# Patient Record
Sex: Female | Born: 1985 | Race: White | Hispanic: No | Marital: Single | Smoking: Current every day smoker
Health system: Southern US, Community
[De-identification: ages and names within clinical notes are randomized; demographics above are authoritative.]

## PROBLEM LIST (undated history)

## (undated) HISTORY — PX: APPENDECTOMY: SHX54

---

## 2012-07-11 ENCOUNTER — Emergency Department (HOSPITAL_COMMUNITY)
Admission: EM | Admit: 2012-07-11 | Discharge: 2012-07-11 | Disposition: A | Discharge status: Home / Self Care | Payer: Managed Care, Other (non HMO) | Attending: Emergency Medicine | Admitting: Emergency Medicine

## 2012-07-11 ENCOUNTER — Encounter (HOSPITAL_COMMUNITY): Payer: Self-pay | Admitting: Emergency Medicine

## 2012-07-11 DIAGNOSIS — N390 Urinary tract infection, site not specified: Secondary | ICD-10-CM | POA: Insufficient documentation

## 2012-07-11 DIAGNOSIS — Z792 Long term (current) use of antibiotics: Secondary | ICD-10-CM | POA: Insufficient documentation

## 2012-07-11 DIAGNOSIS — N9489 Other specified conditions associated with female genital organs and menstrual cycle: Secondary | ICD-10-CM | POA: Insufficient documentation

## 2012-07-11 DIAGNOSIS — T367X5A Adverse effect of antifungal antibiotics, systemically used, initial encounter: Secondary | ICD-10-CM | POA: Insufficient documentation

## 2012-07-11 DIAGNOSIS — T7840XA Allergy, unspecified, initial encounter: Secondary | ICD-10-CM

## 2012-07-11 DIAGNOSIS — Z888 Allergy status to other drugs, medicaments and biological substances status: Secondary | ICD-10-CM | POA: Insufficient documentation

## 2012-07-11 DIAGNOSIS — Z79899 Other long term (current) drug therapy: Secondary | ICD-10-CM | POA: Insufficient documentation

## 2012-07-11 DIAGNOSIS — N760 Acute vaginitis: Secondary | ICD-10-CM | POA: Insufficient documentation

## 2012-07-11 DIAGNOSIS — N898 Other specified noninflammatory disorders of vagina: Secondary | ICD-10-CM | POA: Insufficient documentation

## 2012-07-11 DIAGNOSIS — IMO0002 Reserved for concepts with insufficient information to code with codable children: Secondary | ICD-10-CM | POA: Insufficient documentation

## 2012-07-11 DIAGNOSIS — F17200 Nicotine dependence, unspecified, uncomplicated: Secondary | ICD-10-CM | POA: Insufficient documentation

## 2012-07-11 LAB — WET PREP, GENITAL: Yeast Wet Prep HPF POC: NONE SEEN

## 2012-07-11 MED ORDER — CLINDAMYCIN HCL 150 MG PO CAPS
300.0000 mg | ORAL_CAPSULE | Freq: Once | ORAL | Status: AC
  Administered 2012-07-11: 300 mg via ORAL
  Filled 2012-07-11: qty 2

## 2012-07-11 MED ORDER — CLINDAMYCIN HCL 300 MG PO CAPS: 300.0000 mg | ORAL_CAPSULE | Freq: Two times a day (BID) | ORAL | Status: AC

## 2012-07-11 MED ORDER — ETONOGESTREL-ETHINYL ESTRADIOL 0.12-0.015 MG/24HR VA RING: VAGINAL_RING | VAGINAL | Status: DC

## 2012-07-11 MED ORDER — LIDOCAINE HCL 2 % EX GEL
Freq: Once | CUTANEOUS | Status: AC
  Administered 2012-07-11: 20 via URETHRAL
  Filled 2012-07-11: qty 20

## 2012-07-11 MED ORDER — HYDROCORTISONE 1 % EX OINT
TOPICAL_OINTMENT | Freq: Once | CUTANEOUS | Status: AC
  Administered 2012-07-11: 03:00:00 via TOPICAL
  Filled 2012-07-11: qty 28.35

## 2012-07-11 MED ORDER — PREDNISONE 20 MG PO TABS
60.0000 mg | ORAL_TABLET | Freq: Once | ORAL | Status: AC
  Administered 2012-07-11: 60 mg via ORAL
  Filled 2012-07-11: qty 3

## 2012-07-11 MED ORDER — DIPHENHYDRAMINE HCL 25 MG PO CAPS
25.0000 mg | ORAL_CAPSULE | Freq: Once | ORAL | Status: AC
  Administered 2012-07-11: 25 mg via ORAL
  Filled 2012-07-11: qty 1

## 2012-07-11 MED ORDER — DESONIDE 0.05 % EX OINT
TOPICAL_OINTMENT | Freq: Once | CUTANEOUS | Status: DC
  Filled 2012-07-11: qty 15

## 2012-07-11 MED ORDER — DESONIDE 0.05 % EX LOTN: TOPICAL_LOTION | Freq: Two times a day (BID) | CUTANEOUS | Status: DC

## 2012-07-11 MED ORDER — PREDNISONE 10 MG PO TABS: 20.0000 mg | ORAL_TABLET | Freq: Every day | ORAL | Status: DC

## 2012-07-11 NOTE — ED Notes (Signed)
Medication requested from pharmacy.

## 2012-07-11 NOTE — ED Notes (Signed)
PT. DIAGNOSED WITH UTI AND PRESCRIBED WITH MACROBID AT AN URGENT CARE LAST Monday , DEVELOPED YEAST INFECTION PRESCRIBED WITH VAGINAL CREAM AT SCHOOL - DEVELOPED IRRITATION /PAINFUL RASH FROM CREAM , PRESCRIBED WITH DIFLUCAN TABS WITH NO RELIEF.

## 2012-07-11 NOTE — ED Provider Notes (Signed)
Medical screening examination/treatment/procedure(s) were performed by non-physician practitioner and as supervising physician I was immediately available for consultation/collaboration.    Brandt Loosen, MD 07/11/12 [phone]

## 2012-07-11 NOTE — ED Provider Notes (Signed)
History     CSN: [id number]  Arrival date & time 07/11/12  0008   First MD Initiated Contact with Patient 07/11/12 0057      Chief Complaint  Patient presents with  . Rash    (Consider location/radiation/quality/duration/timing/severity/associated sxs/prior treatment) HPI Comments: PT. DIAGNOSED WITH UTI AND PRESCRIBED WITH MACROBID AT AN URGENT CARE LAST Monday , DEVELOPED YEAST INFECTION PRESCRIBED WITH VAGINAL CREAM AT SCHOOL - DEVELOPED IRRITATION /PAINFUL RASH FROM CREAM , PRESCRIBED WITH DIFLUCAN TABS WITH NO RELIEF.   Has been unable to bath properly due to pain Painful to touch  Boyfriend at bedside denies any penile discharge genital sores  Tested for STD 2 days ago at school clinic--negative   Patient is a 26 y.o. female presenting with rash. The history is provided by the patient.  Rash  This is a new problem. The current episode started 2 days ago. The problem has been gradually worsening. There has been no fever. The rash is present on the groin. The pain is at a severity of 9/10. The pain is moderate. The pain has been constant since onset. Associated symptoms include blisters and pain. She has tried nothing for the symptoms. The treatment provided no relief. Risk factors include new medications.    History reviewed. No pertinent past medical history.  Past Surgical History  Procedure Date  . Appendectomy     No family history on file.  History  Substance Use Topics  . Smoking status: Current Every Day Smoker  . Smokeless tobacco: Not on file  . Alcohol Use: Yes    OB History    Grav Para Term Preterm Abortions TAB SAB Ect Mult Living                  Review of Systems  Constitutional: Negative for fever and chills.  Genitourinary: Positive for dysuria, vaginal discharge and genital sores.  Skin: Positive for rash. Negative for wound.  Neurological: Negative for dizziness and weakness.    Allergies  Terconazole  Home Medications   Current  Outpatient Rx  Name Route Sig Dispense Refill  . FLUCONAZOLE 150 MG PO TABS Oral Take 150 mg by mouth once.    Marland Kitchen NITROFURANTOIN MONOHYD MACRO 100 MG PO CAPS Oral Take 100 mg by mouth 2 (two) times daily.    . TERCONAZOLE 0.4 % VA CREA Vaginal Place 1 applicator vaginally at bedtime.    Marland Kitchen CLINDAMYCIN HCL 300 MG PO CAPS Oral Take 1 capsule (300 mg total) by mouth 2 (two) times daily. 13 capsule 0  . DESONIDE 0.05 % EX LOTN Topical Apply topically 2 (two) times daily. 59 mL 0  . ETONOGESTREL-ETHINYL ESTRADIOL 0.12-0.015 MG/24HR VA RING  Insert vaginally and leave in place for 3 consecutive weeks, then remove for 1 week. 1 each 12  . PREDNISONE 10 MG PO TABS Oral Take 2 tablets (20 mg total) by mouth daily. 10 tablet 0    BP 115/71  Pulse 104  Temp 98.1 F (36.7 C) (Oral)  Resp 18  SpO2 100%  LMP 06/16/2012  Physical Exam  Constitutional: She appears well-developed and well-nourished.  HENT:  Head: Normocephalic.  Eyes: Pupils are equal, round, and reactive to light.  Neck: Normal range of motion.  Cardiovascular: Normal rate.   Pulmonary/Chest: Effort normal.  Abdominal: Soft. She exhibits no distension.  Genitourinary: There is rash, tenderness and lesion on the right labia. There is rash, tenderness and lesion on the left labia. Vaginal discharge found.  Copious thick yellow discharge with small open areas along edges of labia not consist ant with Herpes like lesions  Patient removed her Nuvaring thinking this may be the cause of her discharge  Musculoskeletal: Normal range of motion.  Lymphadenopathy:       Right: Inguinal adenopathy present.       Left: Inguinal adenopathy present.  Neurological: She is alert.  Skin: Skin is warm.    ED Course  Procedures (including critical care time)  Labs Reviewed  WET PREP, GENITAL - Abnormal; Notable for the following:    Clue Cells Wet Prep HPF POC MODERATE (*)     WBC, Wet Prep HPF POC MANY (*)     All other components  within normal limits   No results found.   1. Allergic reaction caused by a drug   2. Bacterial vaginitis       MDM          Arman Filter, NP 07/11/12 [phone]

## 2012-07-11 NOTE — ED Notes (Signed)
Call x 1. No answer.

## 2013-11-14 ENCOUNTER — Emergency Department (HOSPITAL_COMMUNITY)
Admission: EM | Admit: 2013-11-14 | Discharge: 2013-11-14 | Disposition: A | Discharge status: Home / Self Care | Payer: PRIVATE HEALTH INSURANCE | Attending: Emergency Medicine | Admitting: Emergency Medicine

## 2013-11-14 ENCOUNTER — Emergency Department (HOSPITAL_COMMUNITY): Payer: PRIVATE HEALTH INSURANCE

## 2013-11-14 ENCOUNTER — Encounter (HOSPITAL_COMMUNITY): Payer: Self-pay | Admitting: Emergency Medicine

## 2013-11-14 DIAGNOSIS — S52509A Unspecified fracture of the lower end of unspecified radius, initial encounter for closed fracture: Secondary | ICD-10-CM

## 2013-11-14 DIAGNOSIS — Y9301 Activity, walking, marching and hiking: Secondary | ICD-10-CM | POA: Insufficient documentation

## 2013-11-14 DIAGNOSIS — W010XXA Fall on same level from slipping, tripping and stumbling without subsequent striking against object, initial encounter: Secondary | ICD-10-CM | POA: Insufficient documentation

## 2013-11-14 DIAGNOSIS — S52599A Other fractures of lower end of unspecified radius, initial encounter for closed fracture: Secondary | ICD-10-CM | POA: Insufficient documentation

## 2013-11-14 DIAGNOSIS — Y929 Unspecified place or not applicable: Secondary | ICD-10-CM | POA: Insufficient documentation

## 2013-11-14 DIAGNOSIS — Z79899 Other long term (current) drug therapy: Secondary | ICD-10-CM | POA: Insufficient documentation

## 2013-11-14 DIAGNOSIS — F17200 Nicotine dependence, unspecified, uncomplicated: Secondary | ICD-10-CM | POA: Insufficient documentation

## 2013-11-14 MED ORDER — HYDROCODONE-ACETAMINOPHEN 5-325 MG PO TABS
1.0000 | ORAL_TABLET | Freq: Once | ORAL | Status: AC
  Administered 2013-11-14: 1 via ORAL
  Filled 2013-11-14: qty 1

## 2013-11-14 MED ORDER — HYDROCODONE-ACETAMINOPHEN 5-325 MG PO TABS: 1.0000 | ORAL_TABLET | ORAL | Status: AC | PRN

## 2013-11-14 NOTE — ED Notes (Signed)
She is awake, alert and oriented and in no distress.  She is made aware we are notifying ortho. Tech.

## 2013-11-14 NOTE — ED Provider Notes (Signed)
CSN: [id number]     Arrival date & time 11/14/13  1191 History   None    Chief Complaint  Patient presents with  . Fall     (Consider location/radiation/quality/duration/timing/severity/associated sxs/prior Treatment) HPI Comments: Pt states that she was drinking and slipped on the ice a couple of hours ago. No loc with fall. Pt c/o swelling and pain to the left wrist  Patient is a 28 y.o. female presenting with fall. The history is provided by the patient. No language interpreter was used.  Fall This is a new problem. The current episode started today. The problem occurs constantly. The problem has been unchanged. Pertinent negatives include no numbness. The symptoms are aggravated by twisting. She has tried nothing for the symptoms.    History reviewed. No pertinent past medical history. Past Surgical History  Procedure Laterality Date  . Appendectomy     History reviewed. No pertinent family history. History  Substance Use Topics  . Smoking status: Current Every Day Smoker  . Smokeless tobacco: Not on file  . Alcohol Use: Yes   OB History   Grav Para Term Preterm Abortions TAB SAB Ect Mult Living                 Review of Systems  Constitutional: Negative.   Respiratory: Negative.   Cardiovascular: Negative.   Neurological: Negative for numbness.      Allergies  Terconazole  Home Medications   Current Outpatient Rx  Name  Route  Sig  Dispense  Refill  . amphetamine-dextroamphetamine (ADDERALL) 20 MG tablet   Oral   Take 20 mg by mouth 2 (two) times daily.         Marland Kitchen ibuprofen (ADVIL,MOTRIN) 200 MG tablet   Oral   Take 400 mg by mouth every 6 (six) hours as needed.         Marland Kitchen PARAGARD INTRAUTERINE COPPER IU   Intrauterine   1 Device by Intrauterine route continuous.          BP 119/73  Pulse 102  Temp(Src) 98.3 F (36.8 C) (Oral)  Resp 14  SpO2 99%  LMP 11/05/2013 Physical Exam  Nursing note and vitals reviewed. Constitutional: She is  oriented to person, place, and time. She appears well-developed and well-nourished.  Cardiovascular: Normal rate and regular rhythm.   Pulmonary/Chest: Effort normal and breath sounds normal.  Musculoskeletal:  Swelling not the the medial aspect of the left wrist. Pulses intact. Pt unable to supinate.pt has full rom on fingers  Neurological: She is alert and oriented to person, place, and time. Coordination normal.  Skin: Skin is warm and dry.  Psychiatric: She has a normal mood and affect.    ED Course  Procedures (including critical care time) Labs Review Labs Reviewed - No data to display Imaging Review Dg Wrist Complete Left  11/14/2013   CLINICAL DATA:  Status post fall, with pain, swelling and bruising at the base of the left first metacarpal.  EXAM: LEFT WRIST - COMPLETE 3+ VIEW  COMPARISON:  None.  FINDINGS: There is a minimally displaced fracture involving the ulnar aspect of the distal radial metaphysis. No significant step-off is seen at the joint space. The ulnar styloid remains intact.  The carpal rows appear grossly intact, and demonstrate normal alignment. Visualized joint spaces are otherwise preserved. No significant soft tissue abnormalities are characterized on radiograph.  IMPRESSION: Minimally displaced fracture involving the ulnar aspect of the distal radial metaphysis.   Electronically Signed   By: Leotis Shames  Chang M.D.   On: 11/14/2013 06:58   Dg Hand Complete Left  11/14/2013   CLINICAL DATA:  Status post fall; pain, swelling and bruising at the base of the left first metacarpal.  EXAM: LEFT HAND - COMPLETE 3+ VIEW  COMPARISON:  None.  FINDINGS: There is a minimally displaced fracture involving the ulnar aspect of the distal radial metaphysis, better characterized on concurrent wrist radiographs. The base of the first metacarpal is grossly unremarkable in appearance. The joint spaces are preserved; the soft tissues are unremarkable in appearance. The carpal rows are intact,  and demonstrate normal alignment.  IMPRESSION: Minimally displaced fracture involving the ulnar aspect of the distal radial metaphysis, better characterized on concurrent wrist radiographs.   Electronically Signed   By: Roanna RaiderJeffery  Chang M.D.   On: 11/14/2013 06:57     EKG Interpretation None      MDM   Final diagnoses:  Distal radius fracture    Pt neurovascularly intact. Pt splinted given vicodin and follow up with dr. Alyson Reedygramig   Albert Hersch, NP 11/14/13 [phone]

## 2013-11-14 NOTE — ED Notes (Signed)
Pt made aware that ortho staff coming to apply split.

## 2013-11-14 NOTE — ED Provider Notes (Signed)
Medical screening examination/treatment/procedure(s) were performed by non-physician practitioner and as supervising physician I was immediately available for consultation/collaboration.   EKG Interpretation None        Charles B. Bernette MayersSheldon, MD 11/14/13 [phone]

## 2013-11-14 NOTE — ED Notes (Signed)
Patient transported to X-ray 

## 2013-11-14 NOTE — ED Notes (Signed)
Pt arrived to the ED with a complaint of a fall and an injury to her left wrist.  Pt has been drinking this evening.  Pt was walking  Home slipped on ice and fell on her wrist hyperextending it backwards.  Pt has small abrasion on her lower palm with redness and swelling in the same area.

## 2013-11-14 NOTE — ED Notes (Signed)
Ortho tech at bedside 

## 2013-11-14 NOTE — Discharge Instructions (Signed)
Cast or Splint Care °Casts and splints support injured limbs and keep bones from moving while they heal. It is important to care for your cast or splint at home.   °HOME CARE INSTRUCTIONS °· Keep the cast or splint uncovered during the drying period. It can take 24 to 48 hours to dry if it is made of plaster. A fiberglass cast will dry in less than 1 hour. °· Do not rest the cast on anything harder than a pillow for the first 24 hours. °· Do not put weight on your injured limb or apply pressure to the cast until your health care provider gives you permission. °· Keep the cast or splint dry. Wet casts or splints can lose their shape and may not support the limb as well. A wet cast that has lost its shape can also create harmful pressure on your skin when it dries. Also, wet skin can become infected. °· Cover the cast or splint with a plastic bag when bathing or when out in the rain or snow. If the cast is on the trunk of the body, take sponge baths until the cast is removed. °· If your cast does become wet, dry it with a towel or a blow dryer on the cool setting only. °· Keep your cast or splint clean. Soiled casts may be wiped with a moistened cloth. °· Do not place any hard or soft foreign objects under your cast or splint, such as cotton, toilet paper, lotion, or powder. °· Do not try to scratch the skin under the cast with any object. The object could get stuck inside the cast. Also, scratching could lead to an infection. If itching is a problem, use a blow dryer on a cool setting to relieve discomfort. °· Do not trim or cut your cast or remove padding from inside of it. °· Exercise all joints next to the injury that are not immobilized by the cast or splint. For example, if you have a long leg cast, exercise the hip joint and toes. If you have an arm cast or splint, exercise the shoulder, elbow, thumb, and fingers. °· Elevate your injured arm or leg on 1 or 2 pillows for the first 1 to 3 days to decrease  swelling and pain. It is best if you can comfortably elevate your cast so it is higher than your heart. °SEEK MEDICAL CARE IF:  °· Your cast or splint cracks. °· Your cast or splint is too tight or too loose. °· You have unbearable itching inside the cast. °· Your cast becomes wet or develops a soft spot or area. °· You have a bad smell coming from inside your cast. °· You get an object stuck under your cast. °· Your skin around the cast becomes red or raw. °· You have new pain or worsening pain after the cast has been applied. °SEEK IMMEDIATE MEDICAL CARE IF:  °· You have fluid leaking through the cast. °· You are unable to move your fingers or toes. °· You have discolored (blue or white), cool, painful, or very swollen fingers or toes beyond the cast. °· You have tingling or numbness around the injured area. °· You have severe pain or pressure under the cast. °· You have any difficulty with your breathing or have shortness of breath. °· You have chest pain. °Document Released: 08/31/2000 Document Revised: 06/24/2013 Document Reviewed: 03/12/2013 °[employer]® Patient Information ©2014 [employer]. ° °Wrist Fracture °A wrist fracture is a break in one of the bones of   the wrist. Your wrist is made up of several small bones at the palm of your hand (carpal bones) and the two bones that make up your forearm (radius and ulna). The bones come together to form multiple large and small joints. The shape and design of these joints allow your wrist to bend and straighten, move side-to-side, and rotate, as in twisting your palm up or down. °CAUSES  °A fracture may occur in any of the bones in your wrist when enough force is applied to the wrist, such as when falling down onto an outstretched hand. Severe injuries may occur from a more forceful injury. °SYMPTOMS °Symptoms of wrist fractures include tenderness, bruising, and swelling. Additionally, the wrist may hang in an odd position or may be misshaped. °DIAGNOSIS °To  diagnose a wrist fracture, your caregiver will physically examine your wrist. Your caregiver may also request an X-ray exam of your wrist. °TREATMENT °Treatment depends on many factors, including the nature and location of the fracture, your age, and your activity level. Treatment for wrist fracture can be nonsurgical or surgical. °For nonsurgical treatment, a plaster cast or splint may be applied to your wrist if the bone is in a good position (aligned). The cast will stay on for about 6 weeks. If the alignment of your bone is not good, it may be necessary to realign (reduce) it. After the bone is reduced, a splint usually is placed on your wrist to allow for a small amount of normal swelling. After about 1 week, the splint is removed and a cast is added. The cast is removed 2 or 3 weeks later, after the swelling goes down, causing the cast to loosen. Another cast is applied. This cast is removed after about another 2 or 3 weeks, for a total of 4 to 6 weeks of immobilization. °Sometimes the position of the bone is so far out of place that surgery is required to apply a device to hold it together as it heals. If the bone cannot be reduced without cutting the skin around the bone (closed reduction), a cut (incision) is made to allow direct access to the bone to reduce it (open reduction). Depending on the fracture, there are a number of options for holding the bone in place while it heals, including a cast, metal pins, a plate and screws, and a device called an external fixator. With an external fixator, most of the hardware remains outside of the body. °HOME CARE INSTRUCTIONS °· To lessen swelling, keep your injured wrist elevated and move your fingers as much as possible. °· Apply ice to your wrist for the first 1 to 2 days after you have been treated or as directed by your caregiver. Applying ice helps to reduce inflammation and pain. °· Put ice in a plastic bag. °· Place a towel between your skin and the  bag. °· Leave the ice on for 15 to 20 minutes at a time, every 2 hours while you are awake. °· Do not put pressure on any part of your cast or splint. It may break. °· Use a plastic bag to protect your cast or splint from water while bathing or showering. Do not lower your cast or splint into water. °· Only take over-the-counter or prescription medicines for pain as directed by your caregiver. °SEEK IMMEDIATE MEDICAL CARE IF:  °· Your cast or splint gets damaged or breaks. °· You have continued severe pain or more swelling than you did before the cast was put on. °·   Your skin or fingernails below the injury turn blue or gray or feel cold or numb. °· You develop decreased feeling in your fingers. °MAKE SURE YOU: °· Understand these instructions. °· Will watch your condition. °· Will get help right away if you are not doing well or get worse. °Document Released: 06/13/2005 Document Revised: 11/26/2011 Document Reviewed: 09/21/2011 °[employer]® Patient Information ©2014 [employer]. ° °

## 2015-05-11 IMAGING — CR DG HAND COMPLETE 3+V*L*
3 series · 3 of 3 positions shown · non-contrast
Comparison: None.

CLINICAL DATA: Status post fall; pain, swelling and bruising at the
base of the left first metacarpal.

EXAM:
LEFT HAND - COMPLETE 3+ VIEW

[x hand pa left]
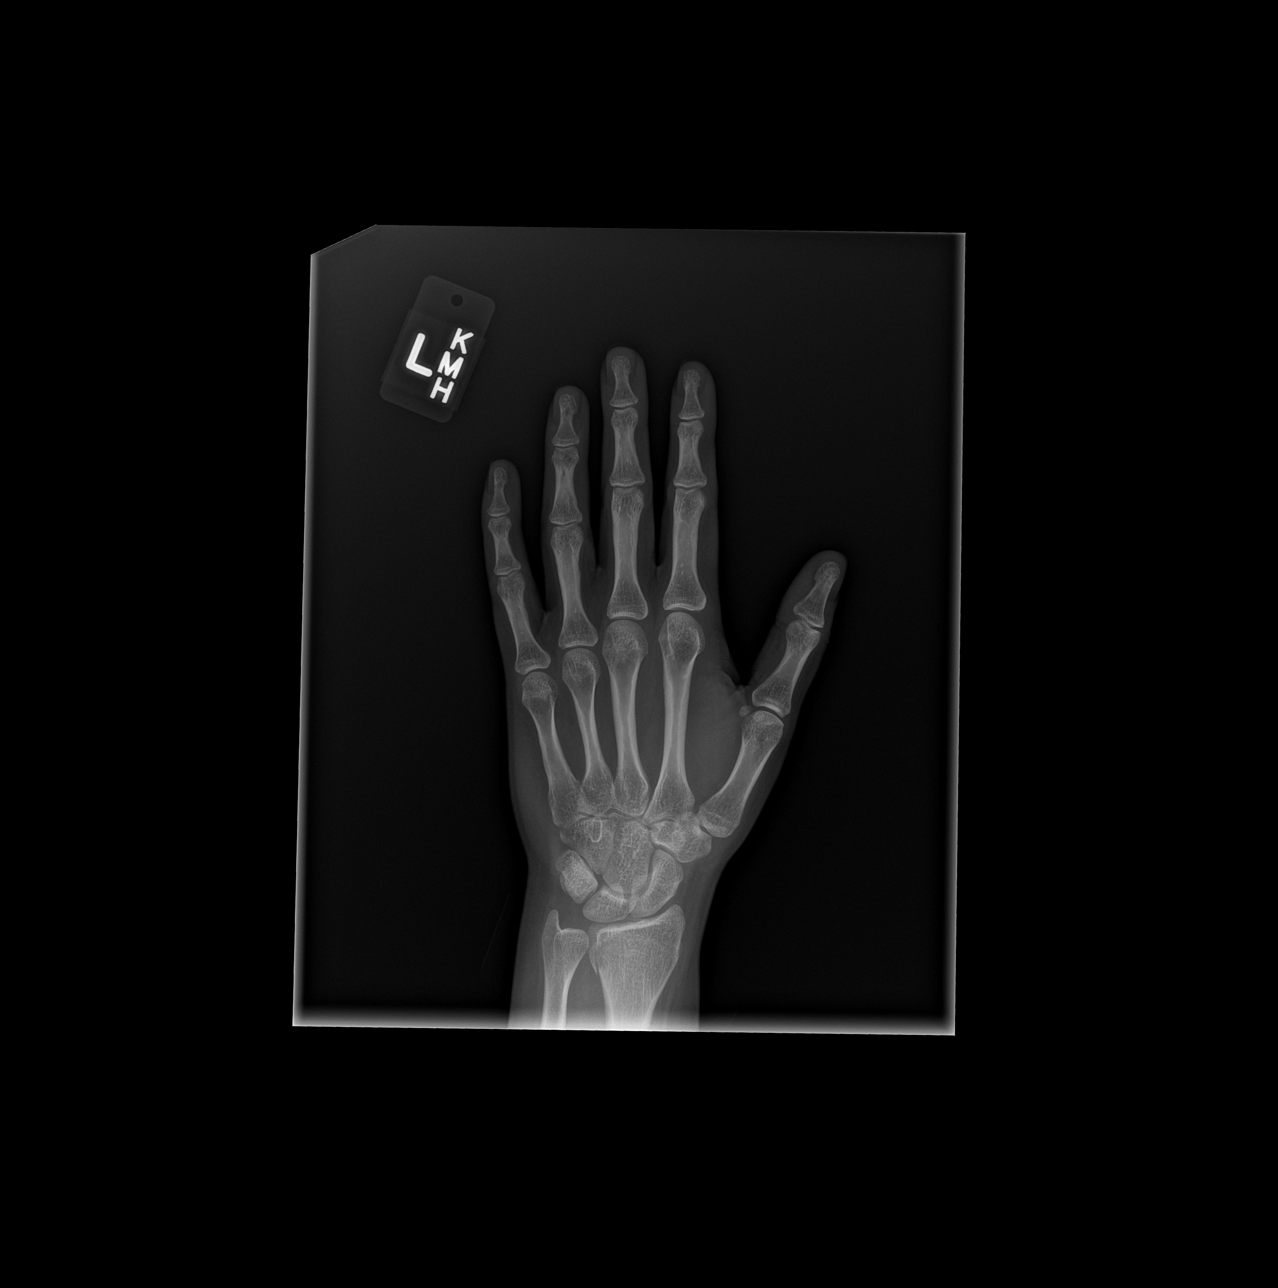

[x hand obl left]
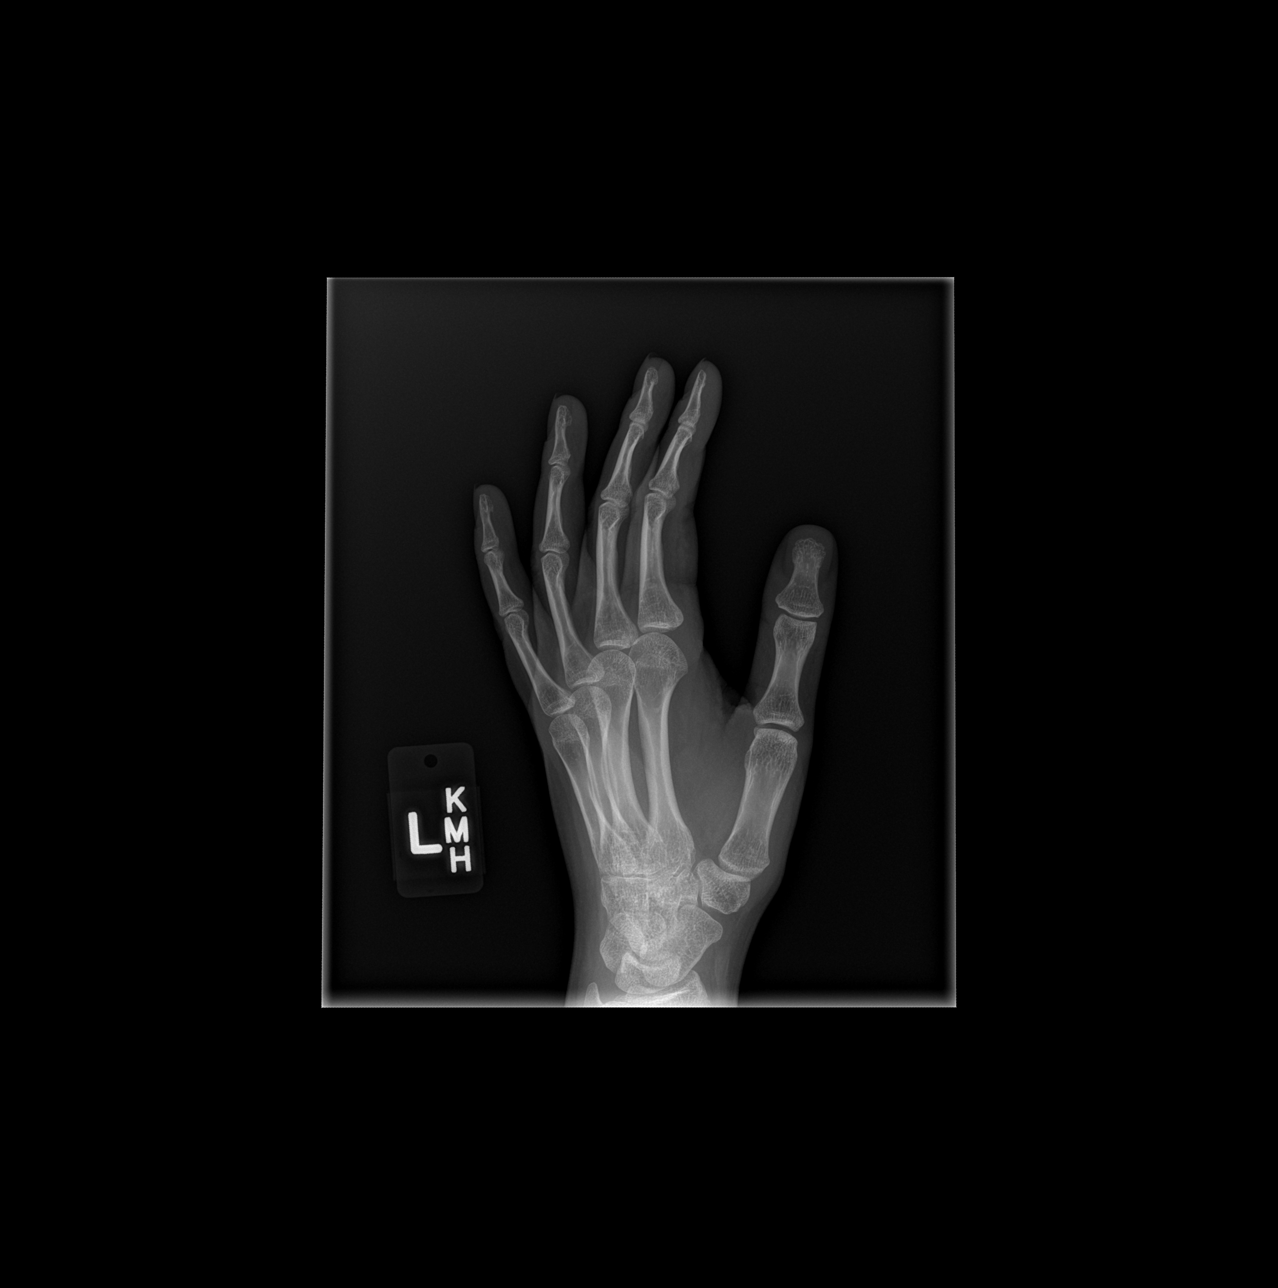

[x hand lat left]
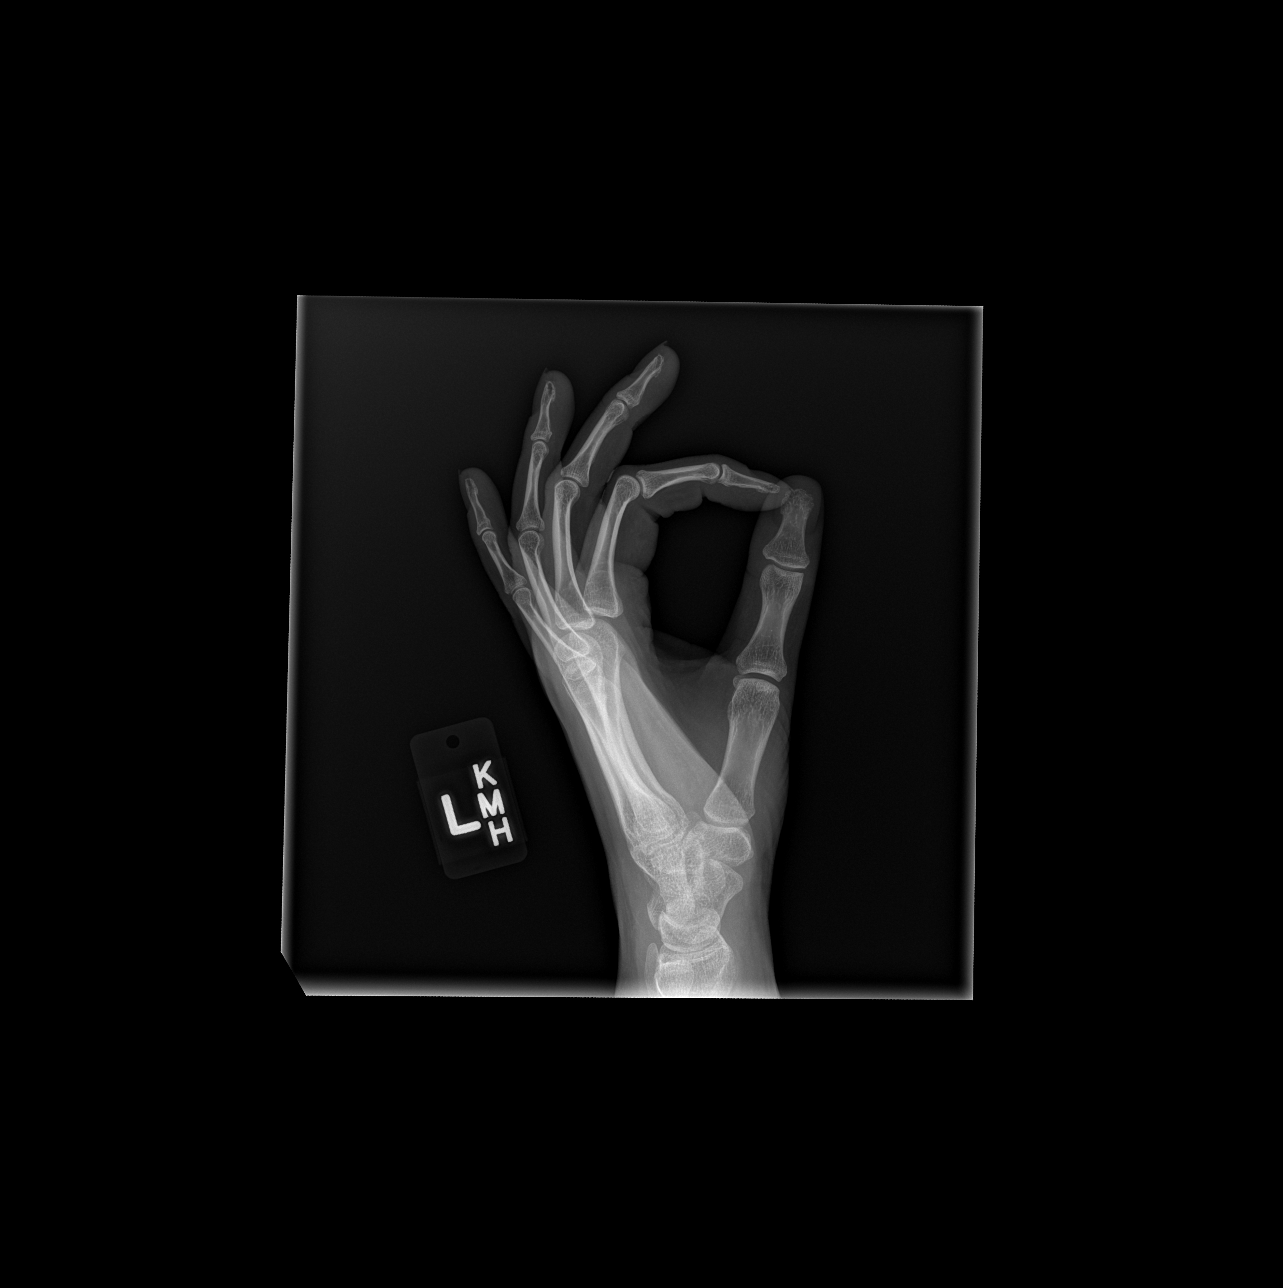

[3 of 3 positions shown; findings below may reference images not displayed]

FINDINGS: There is a minimally displaced fracture involving the ulnar aspect
of the distal radial metaphysis, better characterized on concurrent
wrist radiographs. The base of the first metacarpal is grossly
unremarkable in appearance. The joint spaces are preserved; the soft
tissues are unremarkable in appearance. The carpal rows are intact,
and demonstrate normal alignment.
IMPRESSION: Minimally displaced fracture involving the ulnar aspect of the
distal radial metaphysis, better characterized on concurrent wrist
radiographs.
